# Patient Record
Sex: Male | Born: 1970 | Race: White | Hispanic: No | Marital: Single | Smoking: Current every day smoker
Health system: Southern US, Community
[De-identification: ages and names within clinical notes are randomized; demographics above are authoritative.]

## PROBLEM LIST (undated history)

## (undated) DIAGNOSIS — M1990 Unspecified osteoarthritis, unspecified site: Secondary | ICD-10-CM

## (undated) DIAGNOSIS — R519 Headache, unspecified: Secondary | ICD-10-CM

## (undated) DIAGNOSIS — E7800 Pure hypercholesterolemia, unspecified: Secondary | ICD-10-CM

## (undated) DIAGNOSIS — F329 Major depressive disorder, single episode, unspecified: Secondary | ICD-10-CM

## (undated) DIAGNOSIS — R51 Headache: Secondary | ICD-10-CM

## (undated) DIAGNOSIS — F32A Depression, unspecified: Secondary | ICD-10-CM

## (undated) HISTORY — PX: NO PAST SURGERIES: SHX2092

---

## 2015-03-18 ENCOUNTER — Ambulatory Visit (INDEPENDENT_AMBULATORY_CARE_PROVIDER_SITE_OTHER): Payer: Worker's Compensation | Admitting: Urgent Care

## 2015-03-18 VITALS — BP 122/67 | HR 93 | Temp 98.0°F | Resp 16 | Ht 69.0 in | Wt 177.2 lb

## 2015-03-18 DIAGNOSIS — S0081XA Abrasion of other part of head, initial encounter: Secondary | ICD-10-CM | POA: Diagnosis not present

## 2015-03-18 NOTE — Progress Notes (Signed)
    MRN: [id number] DOB: 08/25/1970  Subjective:   Isaac Jordan is a 44 y.o. male presenting for chief complaint of Facial Injury  Reports work injury to his right side of face today. Patient was working with round metal objects, one fell and bounced off the floor making impact with the right side of his nose, eye and cheek. Reports pain over right bridge of nose, right side of face, slightly blurred vision, mild headache. Has not tried any medications for relief. Denies eye swelling, foreign body sensation, eye redness, eye pain, confusion, disorientation, slurred speech, difficulty moving eyes, ear pain, nose bleeding, tooth pain, jaw pain. Per patient, TDAP is up to date, has had it within the last 10 years, does not want to have it updated today. Denies any other aggravating or relieving factors, no other questions or concerns.  Brett' medications list, allergies, pmh and psh were reviewed and excluded from this note due to being a worker's comp case.   ROS As in subjective.  Objective:   Vitals: BP 122/67 mmHg  Pulse 93  Temp(Src) 98 F (36.7 C) (Oral)  Resp 16  Ht  (1.753 m)  Wt 177 lb 3.2 oz (80.377 kg)  BMI 26.16 kg/m2  SpO2 97%  Physical Exam  Constitutional: He is oriented to person, place, and time. He appears well-developed and well-nourished.  HENT:  Head: Head is with abrasion. Head is without raccoon's eyes, without Battle's sign, without contusion, without laceration, without right periorbital erythema and without left periorbital erythema. Hair is normal.    TM's intact bilaterally, no effusions or erythema. Nares patent, nasal turbinates pink and moist. No sinus tenderness. Nose symmetric, bridge of nose without abnormality. Oropharynx clear, mucous membranes moist, dentition in good repair.  Eyes: Conjunctivae and EOM are normal. Pupils are equal, round, and reactive to light. Right eye exhibits no discharge. Left eye exhibits no discharge. No scleral  icterus.  Neck: Normal range of motion. Neck supple.  Cardiovascular: Normal rate.   Pulmonary/Chest: Effort normal.  Neurological: He is alert and oriented to person, place, and time. He has normal reflexes. No cranial nerve deficit. Coordination normal.  Skin: Skin is warm and dry. No rash noted. No erythema. No pallor.   Assessment and Plan :   1. Facial abrasion, initial encounter - Stable, patient's PE very reassuring, counseled on signs and symptoms of concussion/post-concussion syndrome. Patient is to return to work without restrictions, use ibuprofen for pain and inflammation.  Wallis Bamberg, PA-C [facility] [phone] 03/18/2015 1:57 PM

## 2015-03-18 NOTE — Patient Instructions (Signed)

## 2015-04-07 NOTE — Progress Notes (Signed)
  Medical screening examination/treatment/procedure(s) were performed by non-physician practitioner and as supervising physician I was immediately available for consultation/collaboration.     

## 2016-01-26 ENCOUNTER — Ambulatory Visit
Admission: RE | Admit: 2016-01-26 | Discharge: 2016-01-26 | Disposition: A | Discharge status: Home / Self Care | Payer: BLUE CROSS/BLUE SHIELD | Source: Ambulatory Visit | Attending: Family Medicine | Admitting: Family Medicine

## 2016-01-26 ENCOUNTER — Other Ambulatory Visit: Payer: Self-pay | Admitting: Family Medicine

## 2016-01-26 DIAGNOSIS — M25522 Pain in left elbow: Secondary | ICD-10-CM

## 2017-04-28 ENCOUNTER — Ambulatory Visit
Admission: RE | Admit: 2017-04-28 | Discharge: 2017-04-28 | Disposition: A | Discharge status: Home / Self Care | Payer: BLUE CROSS/BLUE SHIELD | Source: Ambulatory Visit | Attending: Family Medicine | Admitting: Family Medicine

## 2017-04-28 ENCOUNTER — Other Ambulatory Visit: Payer: Self-pay | Admitting: Family Medicine

## 2017-04-28 DIAGNOSIS — M545 Low back pain: Secondary | ICD-10-CM

## 2017-10-26 ENCOUNTER — Other Ambulatory Visit: Payer: Self-pay | Admitting: Family Medicine

## 2017-10-26 DIAGNOSIS — M5416 Radiculopathy, lumbar region: Secondary | ICD-10-CM

## 2017-10-27 ENCOUNTER — Ambulatory Visit
Admission: RE | Admit: 2017-10-27 | Discharge: 2017-10-27 | Disposition: A | Discharge status: Home / Self Care | Payer: BLUE CROSS/BLUE SHIELD | Source: Ambulatory Visit | Attending: Family Medicine | Admitting: Family Medicine

## 2017-10-27 ENCOUNTER — Other Ambulatory Visit: Payer: Self-pay | Admitting: Family Medicine

## 2017-10-27 DIAGNOSIS — M5416 Radiculopathy, lumbar region: Secondary | ICD-10-CM

## 2017-10-27 DIAGNOSIS — Z9889 Other specified postprocedural states: Secondary | ICD-10-CM

## 2017-10-28 ENCOUNTER — Ambulatory Visit
Admission: RE | Admit: 2017-10-28 | Discharge: 2017-10-28 | Disposition: A | Discharge status: Home / Self Care | Payer: BLUE CROSS/BLUE SHIELD | Source: Ambulatory Visit | Attending: Family Medicine | Admitting: Family Medicine

## 2017-10-28 DIAGNOSIS — Z9889 Other specified postprocedural states: Secondary | ICD-10-CM

## 2017-10-31 ENCOUNTER — Other Ambulatory Visit: Payer: Self-pay | Admitting: Neurosurgery

## 2017-11-02 ENCOUNTER — Other Ambulatory Visit: Payer: Self-pay

## 2017-11-02 ENCOUNTER — Encounter (HOSPITAL_COMMUNITY): Payer: Self-pay | Admitting: *Deleted

## 2017-11-02 NOTE — Progress Notes (Signed)
Spoke with pt for pre-op call. Pt denies cardiac history, HTN or diabetes.  

## 2017-11-03 ENCOUNTER — Encounter (HOSPITAL_COMMUNITY)
Admission: RE | Disposition: A | Discharge status: Home / Self Care | Payer: Self-pay | Source: Ambulatory Visit | Attending: Neurosurgery

## 2017-11-03 ENCOUNTER — Encounter (HOSPITAL_COMMUNITY): Payer: Self-pay | Admitting: *Deleted

## 2017-11-03 ENCOUNTER — Other Ambulatory Visit: Payer: Self-pay

## 2017-11-03 ENCOUNTER — Ambulatory Visit (HOSPITAL_COMMUNITY)
Admission: RE | Admit: 2017-11-03 | Discharge: 2017-11-03 | Disposition: A | Discharge status: Home / Self Care | Payer: BLUE CROSS/BLUE SHIELD | Source: Ambulatory Visit | Attending: Neurosurgery | Admitting: Neurosurgery

## 2017-11-03 ENCOUNTER — Ambulatory Visit (HOSPITAL_COMMUNITY): Payer: BLUE CROSS/BLUE SHIELD

## 2017-11-03 ENCOUNTER — Ambulatory Visit (HOSPITAL_COMMUNITY): Payer: BLUE CROSS/BLUE SHIELD | Admitting: Certified Registered"

## 2017-11-03 DIAGNOSIS — M5126 Other intervertebral disc displacement, lumbar region: Secondary | ICD-10-CM | POA: Diagnosis present

## 2017-11-03 DIAGNOSIS — F17200 Nicotine dependence, unspecified, uncomplicated: Secondary | ICD-10-CM | POA: Diagnosis not present

## 2017-11-03 DIAGNOSIS — E7800 Pure hypercholesterolemia, unspecified: Secondary | ICD-10-CM | POA: Insufficient documentation

## 2017-11-03 DIAGNOSIS — Z79899 Other long term (current) drug therapy: Secondary | ICD-10-CM | POA: Diagnosis not present

## 2017-11-03 DIAGNOSIS — Z419 Encounter for procedure for purposes other than remedying health state, unspecified: Secondary | ICD-10-CM

## 2017-11-03 HISTORY — DX: Major depressive disorder, single episode, unspecified: F32.9

## 2017-11-03 HISTORY — DX: Depression, unspecified: F32.A

## 2017-11-03 HISTORY — DX: Headache, unspecified: R51.9

## 2017-11-03 HISTORY — DX: Headache: R51

## 2017-11-03 HISTORY — DX: Unspecified osteoarthritis, unspecified site: M19.90

## 2017-11-03 HISTORY — DX: Pure hypercholesterolemia, unspecified: E78.00

## 2017-11-03 HISTORY — PX: LUMBAR LAMINECTOMY/DECOMPRESSION MICRODISCECTOMY: SHX5026

## 2017-11-03 LAB — CBC
HCT: 50 % (ref 39.0–52.0)
HEMOGLOBIN: 17.2 g/dL — AB (ref 13.0–17.0)
MCH: 31.8 pg (ref 26.0–34.0)
MCHC: 34.4 g/dL (ref 30.0–36.0)
MCV: 92.4 fL (ref 78.0–100.0)
PLATELETS: 166 10*3/uL (ref 150–400)
RBC: 5.41 MIL/uL (ref 4.22–5.81)
RDW: 13.5 % (ref 11.5–15.5)
WBC: 11.8 10*3/uL — ABNORMAL HIGH (ref 4.0–10.5)

## 2017-11-03 SURGERY — LUMBAR LAMINECTOMY/DECOMPRESSION MICRODISCECTOMY 1 LEVEL
Anesthesia: General | Site: Spine Lumbar | Laterality: Left

## 2017-11-03 MED ORDER — ROCURONIUM BROMIDE 100 MG/10ML IV SOLN
INTRAVENOUS | Status: DC | PRN
  Administered 2017-11-03: 40 mg via INTRAVENOUS

## 2017-11-03 MED ORDER — OXYCODONE HCL 5 MG PO TABS: 10.0000 mg | ORAL_TABLET | ORAL | Status: DC | PRN

## 2017-11-03 MED ORDER — SODIUM CHLORIDE 0.9% FLUSH: 3.0000 mL | Freq: Two times a day (BID) | INTRAVENOUS | Status: DC

## 2017-11-03 MED ORDER — FENTANYL CITRATE (PF) 100 MCG/2ML IJ SOLN
INTRAMUSCULAR | Status: AC
  Administered 2017-11-03: 50 ug via INTRAVENOUS
  Filled 2017-11-03: qty 2

## 2017-11-03 MED ORDER — SUGAMMADEX SODIUM 200 MG/2ML IV SOLN
INTRAVENOUS | Status: AC
  Filled 2017-11-03: qty 4

## 2017-11-03 MED ORDER — ACETAMINOPHEN 650 MG RE SUPP: 650.0000 mg | RECTAL | Status: DC | PRN

## 2017-11-03 MED ORDER — FENTANYL CITRATE (PF) 250 MCG/5ML IJ SOLN
INTRAMUSCULAR | Status: AC
  Filled 2017-11-03: qty 5

## 2017-11-03 MED ORDER — FENTANYL CITRATE (PF) 250 MCG/5ML IJ SOLN
INTRAMUSCULAR | Status: AC
Start: 2017-11-03 — End: ?
  Filled 2017-11-03: qty 5

## 2017-11-03 MED ORDER — CEFAZOLIN SODIUM-DEXTROSE 2-4 GM/100ML-% IV SOLN
2.0000 g | INTRAVENOUS | Status: AC
  Administered 2017-11-03: 2 g via INTRAVENOUS
  Filled 2017-11-03: qty 100

## 2017-11-03 MED ORDER — OXYCODONE HCL ER 10 MG PO T12A
10.0000 mg | EXTENDED_RELEASE_TABLET | Freq: Two times a day (BID) | ORAL | Status: DC
  Administered 2017-11-03: 10 mg via ORAL
  Filled 2017-11-03: qty 1

## 2017-11-03 MED ORDER — ONDANSETRON HCL 4 MG/2ML IJ SOLN: 4.0000 mg | Freq: Four times a day (QID) | INTRAMUSCULAR | Status: DC | PRN

## 2017-11-03 MED ORDER — CHLORHEXIDINE GLUCONATE CLOTH 2 % EX PADS: 6.0000 | MEDICATED_PAD | Freq: Once | CUTANEOUS | Status: DC

## 2017-11-03 MED ORDER — PHENOL 1.4 % MT LIQD: 1.0000 | OROMUCOSAL | Status: DC | PRN

## 2017-11-03 MED ORDER — ROSUVASTATIN CALCIUM 20 MG PO TABS: 20.0000 mg | ORAL_TABLET | Freq: Every day | ORAL | Status: DC

## 2017-11-03 MED ORDER — SUGAMMADEX SODIUM 200 MG/2ML IV SOLN
INTRAVENOUS | Status: DC | PRN
  Administered 2017-11-03: 180 mg via INTRAVENOUS

## 2017-11-03 MED ORDER — LIDOCAINE HCL (CARDIAC) 20 MG/ML IV SOLN
INTRAVENOUS | Status: DC | PRN
  Administered 2017-11-03: 100 mg via INTRAVENOUS

## 2017-11-03 MED ORDER — ACETAMINOPHEN 325 MG PO TABS: 650.0000 mg | ORAL_TABLET | ORAL | Status: DC | PRN

## 2017-11-03 MED ORDER — MUPIROCIN 2 % EX OINT
1.0000 "application " | TOPICAL_OINTMENT | Freq: Once | CUTANEOUS | Status: AC
  Administered 2017-11-03: 1 via TOPICAL

## 2017-11-03 MED ORDER — PROPOFOL 10 MG/ML IV BOLUS
INTRAVENOUS | Status: DC | PRN
  Administered 2017-11-03: 200 mg via INTRAVENOUS

## 2017-11-03 MED ORDER — MIDAZOLAM HCL 2 MG/2ML IJ SOLN
INTRAMUSCULAR | Status: AC
  Filled 2017-11-03: qty 2

## 2017-11-03 MED ORDER — THROMBIN (RECOMBINANT) 5000 UNITS EX SOLR
CUTANEOUS | Status: DC | PRN
  Administered 2017-11-03 (×2): 5000 [IU] via TOPICAL

## 2017-11-03 MED ORDER — OXYCODONE HCL 5 MG PO TABS: 5.0000 mg | ORAL_TABLET | Freq: Once | ORAL | Status: DC | PRN

## 2017-11-03 MED ORDER — MENTHOL 3 MG MT LOZG: 1.0000 | LOZENGE | OROMUCOSAL | Status: DC | PRN

## 2017-11-03 MED ORDER — FENTANYL CITRATE (PF) 100 MCG/2ML IJ SOLN
50.0000 ug | Freq: Once | INTRAMUSCULAR | Status: AC
  Administered 2017-11-03 (×2): 50 ug via INTRAVENOUS

## 2017-11-03 MED ORDER — ONDANSETRON HCL 4 MG PO TABS: 4.0000 mg | ORAL_TABLET | Freq: Four times a day (QID) | ORAL | Status: DC | PRN

## 2017-11-03 MED ORDER — SODIUM CHLORIDE 0.9% FLUSH: 3.0000 mL | INTRAVENOUS | Status: DC | PRN

## 2017-11-03 MED ORDER — ONDANSETRON HCL 4 MG/2ML IJ SOLN
INTRAMUSCULAR | Status: DC | PRN
  Administered 2017-11-03: 4 mg via INTRAVENOUS

## 2017-11-03 MED ORDER — DEXAMETHASONE SODIUM PHOSPHATE 10 MG/ML IJ SOLN
INTRAMUSCULAR | Status: AC
  Filled 2017-11-03: qty 4

## 2017-11-03 MED ORDER — LACTATED RINGERS IV SOLN
INTRAVENOUS | Status: DC
  Administered 2017-11-03 (×2): via INTRAVENOUS

## 2017-11-03 MED ORDER — OXYCODONE HCL 5 MG/5ML PO SOLN: 5.0000 mg | Freq: Once | ORAL | Status: DC | PRN

## 2017-11-03 MED ORDER — ROCURONIUM BROMIDE 10 MG/ML (PF) SYRINGE
PREFILLED_SYRINGE | INTRAVENOUS | Status: AC
  Filled 2017-11-03: qty 20

## 2017-11-03 MED ORDER — MIDAZOLAM HCL 5 MG/5ML IJ SOLN
INTRAMUSCULAR | Status: DC | PRN
  Administered 2017-11-03: 2 mg via INTRAVENOUS

## 2017-11-03 MED ORDER — FENTANYL CITRATE (PF) 100 MCG/2ML IJ SOLN: 50.0000 ug | Freq: Once | INTRAMUSCULAR | Status: AC

## 2017-11-03 MED ORDER — GABAPENTIN 300 MG PO CAPS
300.0000 mg | ORAL_CAPSULE | Freq: Three times a day (TID) | ORAL | Status: DC
  Administered 2017-11-03: 300 mg via ORAL
  Filled 2017-11-03: qty 1

## 2017-11-03 MED ORDER — LIDOCAINE-EPINEPHRINE 0.5 %-1:200000 IJ SOLN
INTRAMUSCULAR | Status: DC | PRN
  Administered 2017-11-03: 30 mL

## 2017-11-03 MED ORDER — DEXAMETHASONE SODIUM PHOSPHATE 10 MG/ML IJ SOLN
INTRAMUSCULAR | Status: DC | PRN
  Administered 2017-11-03: 4 mg via INTRAVENOUS

## 2017-11-03 MED ORDER — HYDROMORPHONE HCL 1 MG/ML IJ SOLN: 0.2500 mg | INTRAMUSCULAR | Status: DC | PRN

## 2017-11-03 MED ORDER — HEMOSTATIC AGENTS (NO CHARGE) OPTIME
TOPICAL | Status: DC | PRN
  Administered 2017-11-03: 1 via TOPICAL

## 2017-11-03 MED ORDER — LIDOCAINE HCL (CARDIAC) 20 MG/ML IV SOLN
INTRAVENOUS | Status: AC
  Filled 2017-11-03: qty 15

## 2017-11-03 MED ORDER — 0.9 % SODIUM CHLORIDE (POUR BTL) OPTIME
TOPICAL | Status: DC | PRN
  Administered 2017-11-03: 1000 mL

## 2017-11-03 MED ORDER — ONDANSETRON HCL 4 MG/2ML IJ SOLN
INTRAMUSCULAR | Status: AC
  Filled 2017-11-03: qty 4

## 2017-11-03 MED ORDER — TIZANIDINE HCL 4 MG PO TABS
4.0000 mg | ORAL_TABLET | Freq: Three times a day (TID) | ORAL | Status: DC
  Administered 2017-11-03: 4 mg via ORAL
  Filled 2017-11-03: qty 1

## 2017-11-03 MED ORDER — THROMBIN 5000 UNITS EX SOLR
CUTANEOUS | Status: AC
  Filled 2017-11-03: qty 10000

## 2017-11-03 MED ORDER — SUCCINYLCHOLINE CHLORIDE 20 MG/ML IJ SOLN
INTRAMUSCULAR | Status: DC | PRN
  Administered 2017-11-03: 120 mg via INTRAVENOUS

## 2017-11-03 MED ORDER — KETOROLAC TROMETHAMINE 15 MG/ML IJ SOLN
15.0000 mg | Freq: Four times a day (QID) | INTRAMUSCULAR | Status: DC
  Administered 2017-11-03: 15 mg via INTRAVENOUS
  Filled 2017-11-03: qty 1

## 2017-11-03 MED ORDER — LIDOCAINE-EPINEPHRINE 0.5 %-1:200000 IJ SOLN
INTRAMUSCULAR | Status: AC
  Filled 2017-11-03: qty 1

## 2017-11-03 MED ORDER — FENTANYL CITRATE (PF) 100 MCG/2ML IJ SOLN
INTRAMUSCULAR | Status: DC | PRN
  Administered 2017-11-03 (×2): 100 ug via INTRAVENOUS
  Administered 2017-11-03: 50 ug via INTRAVENOUS

## 2017-11-03 MED ORDER — POTASSIUM CHLORIDE IN NACL 20-0.9 MEQ/L-% IV SOLN: INTRAVENOUS | Status: DC

## 2017-11-03 MED ORDER — PROPOFOL 10 MG/ML IV BOLUS
INTRAVENOUS | Status: AC
  Filled 2017-11-03: qty 40

## 2017-11-03 MED ORDER — SUCCINYLCHOLINE CHLORIDE 20 MG/ML IJ SOLN
INTRAMUSCULAR | Status: AC
  Filled 2017-11-03: qty 1

## 2017-11-03 MED ORDER — OXYCODONE HCL 5 MG PO TABS: 5.0000 mg | ORAL_TABLET | ORAL | Status: DC | PRN

## 2017-11-03 MED ORDER — MORPHINE SULFATE (PF) 4 MG/ML IV SOLN: 1.0000 mg | INTRAVENOUS | Status: DC | PRN

## 2017-11-03 MED ORDER — MUPIROCIN 2 % EX OINT
TOPICAL_OINTMENT | CUTANEOUS | Status: AC
  Administered 2017-11-03: 1 via TOPICAL
  Filled 2017-11-03: qty 22

## 2017-11-03 SURGICAL SUPPLY — 46 items
BUR MATCHSTICK NEURO 3.0 LAGG (BURR) ×3 IMPLANT
BUR PRECISION FLUTE 5.0 (BURR) IMPLANT
CANISTER SUCT 3000ML PPV (MISCELLANEOUS) ×3 IMPLANT
CARTRIDGE OIL MAESTRO DRILL (MISCELLANEOUS) ×1 IMPLANT
CLOSURE WOUND 1/2 X4 (GAUZE/BANDAGES/DRESSINGS)
DECANTER SPIKE VIAL GLASS SM (MISCELLANEOUS) ×3 IMPLANT
DERMABOND ADVANCED (GAUZE/BANDAGES/DRESSINGS) ×2
DERMABOND ADVANCED .7 DNX12 (GAUZE/BANDAGES/DRESSINGS) ×1 IMPLANT
DIFFUSER DRILL AIR PNEUMATIC (MISCELLANEOUS) ×3 IMPLANT
DRAPE LAPAROTOMY 100X72X124 (DRAPES) ×3 IMPLANT
DRAPE MICROSCOPE LEICA (MISCELLANEOUS) ×3 IMPLANT
DRAPE SURG 17X23 STRL (DRAPES) ×3 IMPLANT
DURAPREP 26ML APPLICATOR (WOUND CARE) ×3 IMPLANT
ELECT REM PT RETURN 9FT ADLT (ELECTROSURGICAL) ×3
ELECTRODE REM PT RTRN 9FT ADLT (ELECTROSURGICAL) ×1 IMPLANT
GAUZE SPONGE 4X4 12PLY STRL (GAUZE/BANDAGES/DRESSINGS) IMPLANT
GAUZE SPONGE 4X4 16PLY XRAY LF (GAUZE/BANDAGES/DRESSINGS) IMPLANT
GLOVE BIOGEL PI IND STRL 7.0 (GLOVE) ×1 IMPLANT
GLOVE BIOGEL PI IND STRL 7.5 (GLOVE) ×1 IMPLANT
GLOVE BIOGEL PI INDICATOR 7.0 (GLOVE) ×2
GLOVE BIOGEL PI INDICATOR 7.5 (GLOVE) ×2
GLOVE ECLIPSE 6.5 STRL STRAW (GLOVE) ×3 IMPLANT
GOWN STRL REUS W/ TWL LRG LVL3 (GOWN DISPOSABLE) ×2 IMPLANT
GOWN STRL REUS W/ TWL XL LVL3 (GOWN DISPOSABLE) ×1 IMPLANT
GOWN STRL REUS W/TWL 2XL LVL3 (GOWN DISPOSABLE) IMPLANT
GOWN STRL REUS W/TWL LRG LVL3 (GOWN DISPOSABLE) ×4
GOWN STRL REUS W/TWL XL LVL3 (GOWN DISPOSABLE) ×2
KIT BASIN OR (CUSTOM PROCEDURE TRAY) ×3 IMPLANT
KIT ROOM TURNOVER OR (KITS) ×3 IMPLANT
NEEDLE HYPO 25X1 1.5 SAFETY (NEEDLE) ×3 IMPLANT
NEEDLE SPNL 18GX3.5 QUINCKE PK (NEEDLE) IMPLANT
NS IRRIG 1000ML POUR BTL (IV SOLUTION) ×3 IMPLANT
OIL CARTRIDGE MAESTRO DRILL (MISCELLANEOUS) ×3
PACK LAMINECTOMY NEURO (CUSTOM PROCEDURE TRAY) ×3 IMPLANT
PAD ARMBOARD 7.5X6 YLW CONV (MISCELLANEOUS) ×9 IMPLANT
RUBBERBAND STERILE (MISCELLANEOUS) ×6 IMPLANT
SPONGE LAP 4X18 X RAY DECT (DISPOSABLE) IMPLANT
SPONGE SURGIFOAM ABS GEL SZ50 (HEMOSTASIS) ×3 IMPLANT
STRIP CLOSURE SKIN 1/2X4 (GAUZE/BANDAGES/DRESSINGS) IMPLANT
SUT VIC AB 0 CT1 18XCR BRD8 (SUTURE) ×1 IMPLANT
SUT VIC AB 0 CT1 8-18 (SUTURE) ×2
SUT VIC AB 2-0 CT1 18 (SUTURE) ×3 IMPLANT
SUT VIC AB 3-0 SH 8-18 (SUTURE) ×3 IMPLANT
TOWEL GREEN STERILE (TOWEL DISPOSABLE) ×3 IMPLANT
TOWEL GREEN STERILE FF (TOWEL DISPOSABLE) ×3 IMPLANT
WATER STERILE IRR 1000ML POUR (IV SOLUTION) ×3 IMPLANT

## 2017-11-03 NOTE — Anesthesia Postprocedure Evaluation (Signed)
Anesthesia Post Note  Patient: Toula Moosicholas Bartolini  Procedure(s) Performed: MICRODISCECTOMY LUMBAR FOUR- LUMBAR FIVE LEFT (Left Spine Lumbar)     Patient location during evaluation: PACU Anesthesia Type: General Level of consciousness: awake and alert Pain management: pain level controlled Vital Signs Assessment: post-procedure vital signs reviewed and stable Respiratory status: spontaneous breathing, nonlabored ventilation, respiratory function stable and patient connected to nasal cannula oxygen Cardiovascular status: blood pressure returned to baseline and stable Postop Assessment: no apparent nausea or vomiting Anesthetic complications: no    Last Vitals:  Vitals:   11/03/17 1415 11/03/17 1425  BP: (!) 145/92 (!) 146/89  Pulse: 86 90  Resp: 12 12  Temp:  36.6 C  SpO2: 97% 94%    Last Pain:  Vitals:   11/03/17 1425  TempSrc:   PainSc: 0-No pain    LLE Motor Response: Purposeful movement;Responds to commands (11/03/17 1425) LLE Sensation: Full sensation;No numbness;No pain;No tingling (11/03/17 1425) RLE Motor Response: Purposeful movement;Responds to commands (11/03/17 1425) RLE Sensation: Full sensation;No numbness;No tingling;No pain (11/03/17 1425)      Lydiana Milley,JAMES TERRILL

## 2017-11-03 NOTE — H&P (Signed)
BP (!) 157/93   Pulse 88   Temp 98.5 F (36.9 C) (Oral)   Resp 18   Ht 5\' 7"  (1.702 m)   Wt 90.7 kg (200 lb)   SpO2 100%   BMI 31.32 kg/m  Mr. Toula Moosicholas Wickens presents to the office today for evaluation of pain which he has had now for approximately a month. It became quite severe 2 weeks ago. He has had no antecedent trauma. He did lift a relative who is heavy, but they are not sure that did anything. 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 He has not been able to work since last week Thursday. He describes pain, numbness, and left hip pain in the left thigh and leg. He does report it insomnia. Says it hurts even worse when he stands. He says his left lower extremity does feel weaker full than it has.  He is 47 years of age. Height 5 feet 7 inches, temperature is 97.1 blood pressure is 147/95, pulse is 90. Pain is 9/10.  He does drink alcohol socially. Does smoke 2 packs a day for a 60 pack-year history. He has no history of substance abuse. He is right handed. PAST MEDICAL HISTORY: Otherwise good. ALLERGIES: No known drug allergie MEDICATIONS: He takes Rosuvastatin, Hydrocodone, and Omega-3 pills. FAMILY HISTORY: Mother is [id number], in good health. Father is deceased, had heart failure.  He says the pain truly started in September of 2018 and has simply gotten worse. He says in his words he can hardly walk, not able have a bowel movement in 3 days secondary to constipation caused by the pain medication. REVIEW OF SYSTEMS: Positive for hypercholesterolemia, leg pain with walking, arthritis, back pain, leg pain at rest. PHYSICAL EXAMINATION: He has intact proprioception in the upper and lower extremities. 2+ reflexes, biceps, triceps, brachioradialis, knees, and ankles. Pupils equal, round, and reactive to light. Full extraocular movements. Full visual fields. Hearing intact to voice. Uvula elevates midline. Shoulder shrug is normal. Tongue protrudes in the midline. He has symmetric facial  sensation and movement. He has downgoing toes to plantar stimulation. He has weakness in the left hip flexors, left quadriceps. Dorsiflexors, plantar flexors are normal. Normal strength in the right lower extremity. He has exceeding difficulty standing from a sitting position. He has great difficulty taking a 14-inch step using his left lower extremity. Actually, he is unable to get up on a step.  MRI shows a large disc herniation eccentric to the left with a free fragment. I do believe this is a problem that he is having. I do believe that removing this disc herniation would relieve much of his pain and improve his strength. I do not believe that there is any other reasonable alternative. We did discuss injections, we did discuss physical therapy, and both would be ineffective. Given the fact that he is weak and given the fact that he does have a disc herniation consistent with territory weakness, I have recommended and he has agreed to undergo operative decompression. Risks and benefits including bleeding, infection, no pain relief, need for further surgery, disc recurrence, damage to the nerve roots, damage to the bowel and bladder function, along with other risks were discussed. He received a detailed instruction sheet with regard to the operation.

## 2017-11-03 NOTE — Anesthesia Procedure Notes (Signed)
Procedure Name: Intubation Date/Time: 11/03/2017 11:51 AM Performed by: Josie Dixon, CRNA Pre-anesthesia Checklist: Patient identified, Emergency Drugs available, Suction available and Patient being monitored Patient Re-evaluated:Patient Re-evaluated prior to induction Oxygen Delivery Method: Circle system utilized Preoxygenation: Pre-oxygenation with 100% oxygen Induction Type: IV induction Ventilation: Mask ventilation without difficulty and Mask ventilation throughout procedure Laryngoscope Size: Mac and 3 Grade View: Grade I Tube type: Oral Tube size: 7.5 mm Number of attempts: 1 Airway Equipment and Method: Stylet Placement Confirmation: ETT inserted through vocal cords under direct vision,  positive ETCO2 and breath sounds checked- equal and bilateral Secured at: 22 cm Tube secured with: Tape Dental Injury: Teeth and Oropharynx as per pre-operative assessment

## 2017-11-03 NOTE — Transfer of Care (Signed)
Immediate Anesthesia Transfer of Care Note  Patient: Isaac Jordan  Procedure(s) Performed: MICRODISCECTOMY LUMBAR FOUR- LUMBAR FIVE LEFT (Left Spine Lumbar)  Patient Location: PACU  Anesthesia Type:General  Level of Consciousness: drowsy and patient cooperative  Airway & Oxygen Therapy: Patient Spontanous Breathing and Patient connected to face mask oxygen  Post-op Assessment: Report given to RN and Post -op Vital signs reviewed and stable  Post vital signs: Reviewed and stable  Last Vitals:  Vitals:   11/03/17 0926 11/03/17 0943  BP: (!) 146/101 (!) 157/93  Pulse: 88   Resp: 18   Temp: 36.9 C   SpO2: 100%     Last Pain:  Vitals:   11/03/17 0937  TempSrc:   PainSc: 8       Patients Stated Pain Goal: 5 (11/03/17 0937)  Complications: No apparent anesthesia complications

## 2017-11-03 NOTE — Anesthesia Preprocedure Evaluation (Addendum)
Anesthesia Evaluation  Patient identified by MRN, date of birth, ID band Patient awake    Reviewed: Allergy & Precautions, NPO status , Patient's Chart, lab work & pertinent test results  Airway Mallampati: I  TM Distance: >3 FB Neck ROM: Full    Dental no notable dental hx. (+) Teeth Intact, Dental Advisory Given   Pulmonary neg pulmonary ROS, Current Smoker,    breath sounds clear to auscultation       Cardiovascular negative cardio ROS   Rhythm:Regular Rate:Normal     Neuro/Psych negative neurological ROS  negative psych ROS   GI/Hepatic negative GI ROS, Neg liver ROS,   Endo/Other  negative endocrine ROS  Renal/GU negative Renal ROS  negative genitourinary   Musculoskeletal negative musculoskeletal ROS (+) Back pain   Abdominal   Peds negative pediatric ROS (+)  Hematology negative hematology ROS (+)   Anesthesia Other Findings   Reproductive/Obstetrics negative OB ROS                            Anesthesia Physical Anesthesia Plan  ASA: II  Anesthesia Plan: General   Post-op Pain Management:    Induction: Intravenous  PONV Risk Score and Plan: 2 and Treatment may vary due to age or medical condition, Dexamethasone and Ondansetron  Airway Management Planned: Oral ETT  Additional Equipment:   Intra-op Plan:   Post-operative Plan: Extubation in OR  Informed Consent: I have reviewed the patients History and Physical, chart, labs and discussed the procedure including the risks, benefits and alternatives for the proposed anesthesia with the patient or authorized representative who has indicated his/her understanding and acceptance.   Dental advisory given  Plan Discussed with: CRNA  Anesthesia Plan Comments:         Anesthesia Quick Evaluation

## 2017-11-03 NOTE — Discharge Summary (Signed)
Physician Discharge Summary  Patient ID: Isaac Jordan MRN: [id number] DOB/AGE: 45/11/1970 45 y.o.  Admit date: 11/03/2017 Discharge date: 11/03/2017  Admission Diagnoses:HNP L4/5, left  Discharge Diagnoses: same Active Problems:   HNP (herniated nucleus pulposus), lumbar   Discharged Condition: good  Hospital Course: [name] was taken to the OR for an uncomplicated lumbar laminectomy and discetomy at L4/5 on the left. Post op he is ambulating, voiding, and tolerating a regular diet. His wound is clean, dry, and without signs of infection. He will be discharged home.  Treatments: surgery: as above  Discharge Exam: Blood pressure (!) 146/92, pulse 90, temperature 98.5 F (36.9 C), temperature source Oral, resp. rate 16, height 5\' 7"  (1.702 m), weight 90.7 kg (200 lb), SpO2 96 %. General appearance: alert, cooperative and appears stated age Neurologic: Alert and oriented X 3, normal strength and tone. Normal symmetric reflexes. Normal coordination and gait  Disposition: Discharge disposition: 01-Home or Self Care      DISC DISPLACEMENT, LUMBAR  Allergies as of 11/03/2017   No Known Allergies     Medication List    TAKE these medications   FISH OIL PO Take 2 capsules by mouth daily.   HYDROcodone-acetaminophen 5-325 MG tablet Commonly known as:  NORCO/VICODIN Take 1 tablet by mouth every 4 (four) hours as needed for moderate pain.   ibuprofen 200 MG tablet Commonly known as:  ADVIL,MOTRIN Take 400 mg by mouth every 6 (six) hours as needed for headache or moderate pain.   naproxen sodium 220 MG tablet Commonly known as:  ALEVE Take 440 mg by mouth daily as needed (for pain or headache).   rosuvastatin 20 MG tablet Commonly known as:  CRESTOR Take 20 mg by mouth at bedtime.   tiZANidine 4 MG tablet Commonly known as:  ZANAFLEX Take 4 mg by mouth 3 (three) times daily.      Follow-up Information    Coletta Memosabbell, Zali Kamaka, MD Follow up in 3 week(s).   Specialty:   Neurosurgery Why:  please call to make an appointment Contact information: [address] [place] [address] [phone]           Signed: Carmela HurtCABBELL,Saryn Cherry L 11/03/2017, 7:09 PM

## 2017-11-03 NOTE — Progress Notes (Signed)
Patient discharge to home with family members. Discharge instructions given and verbalized understanding. Denies pain and in no signs of distress at the time of discharge.

## 2017-11-03 NOTE — Discharge Instructions (Signed)
Lumbar Discectomy °Care After °A discectomy involves removal of discmaterial (the cartilage-like structures located between the bones of the back). It is done to relieve pressure on nerve roots. It can be used as a treatment for a back problem. The time in surgery depends on the findings in surgery and what is necessary to correct the problems. °HOME CARE INSTRUCTIONS  °· Check the cut (incision) made by the surgeon twice a day for signs of infection. Some signs of infection may include:  °· A foul smelling, greenish or yellowish discharge from the wound.  °· Increased pain.  °· Increased redness over the incision (operative) site.  °· The skin edges may separate.  °· Flu-like symptoms (problems).  °· A temperature above 101.5° F (38.6° C).  °· Change your bandages in about 24 to 36 hours following surgery or as directed.  °· You may shower tomrrow.  Avoid bathtubs, swimming pools and hot tubs for three weeks or until your incision has healed completely. °· Follow your doctor's instructions as to safe activities, exercises, and physical therapy.  °· Weight reduction may be beneficial if you are overweight.  °· Daily exercise is helpful to prevent the return of problems. Walking is permitted. You may use a treadmill without an incline. Cut down on activities and exercise if you have discomfort. You may also go up and down stairs as much as you can tolerate.  °· DO NOT lift anything heavier than 10 to 15 lbs. Avoid bending or twisting at the waist. Always bend your knees when lifting.  °· Maintain strength and range of motion as instructed.  °· Do not drive for 10 days, or as directed by your doctors. You may be a passenger . Lying back in the passenger seat may be more comfortable for you. Always wear a seatbelt.  °· Limit your sitting in a regular chair to 20 to 30 minutes at a time. There are no limitations for sitting in a recliner. You should lie down or walk in between sitting periods.  °· Only take  over-the-counter or prescription medicines for pain, discomfort, or fever as directed by your caregiver.  °SEEK MEDICAL CARE IF:  °· There is increased bleeding (more than a small spot) from the wound.  °· You notice redness, swelling, or increasing pain in the wound.  °· Pus is coming from wound.  °· You develop an unexplained oral temperature above 102° F (38.9° C) develops.  °· You notice a foul smell coming from the wound or dressing.  °· You have increasing pain in your wound.  °SEEK IMMEDIATE MEDICAL CARE IF:  °· You develop a rash.  °· You have difficulty breathing.  °· You develop any allergic problems to medicines given.  °Document Released: 07/13/2004 Document Revised: 07/28/2011 Document Reviewed: 11/01/2007 °ExitCare® Patient Information °

## 2017-11-03 NOTE — Op Note (Signed)
11/03/2017  1:29 PM  PATIENT:  Isaac Jordan  47 y.o. male with severe pain in the left lower extremity and a large hnp eccentric to the left side with a rostrally migrated free fragment  PRE-OPERATIVE DIAGNOSIS:  DISC DISPLACEMENT, LUMBAR 4/5  POST-OPERATIVE DIAGNOSIS:  DISC DISPLACEMENT, LUMBAR 4/5  PROCEDURE:  Procedure(s): MICRODISCECTOMY LUMBAR FOUR- LUMBAR FIVE LEFT  SURGEON:   Surgeon(s): Coletta Memosabbell, Rahim Astorga, MD  ASSISTANTS:none  ANESTHESIA:   general  EBL:  No intake/output data recorded.  BLOOD ADMINISTERED:none  CELL SAVER GIVEN:none  COUNT:per nursing  DRAINS: none   SPECIMEN:  No Specimen  DICTATION: [name] was taken to the operating room, intubated and placed under a general anesthetic without difficulty. He was positioned prone on a Wilson frame with all pressure points padded. His back was prepped and draped in a sterile manner. I opened the skin with a 10 blade and carried the dissection down to the thoracolumbar fascia. I used both sharp dissection and the monopolar cautery to expose the lamina of L4, and L5. I confirmed my location with an intraoperative xray.  I used the drill, Kerrison punches, and curettes to perform a semihemilaminectomy of L4. I used the punches to remove the ligamentum flavum to expose the thecal sac. I brought the microscope into the operative field and started the decompression of the spinal canal, thecal sac and L4, and L5 root(s). I cauterized epidural veins overlying the disc space then divided them sharply. I used a right angle ball dissector and removed pieces of disc rostral to the disc space. I also remove disc fragments from the neural foramen on the left at L4/5. I opened the disc space with a 15 blade and proceeded with the discectomy. I used pituitary rongeurs, curettes, and other instruments to remove disc material. After the discectomy was completed I inspected the L4, and L5 nerve roots and felt they were well decompressed. I  explored rostrally, laterally, medially, and caudally and was satisfied with the decompression. I irrigated the wound, then closed in layers. I approximated the thoracolumbar fascia, subcutaneous, and subcuticular planes with vicryl sutures. I used dermabond for a sterile dressing.   PLAN OF CARE: Admit for overnight observation  PATIENT DISPOSITION:  PACU - hemodynamically stable.   Delay start of Pharmacological VTE agent (>24hrs) due to surgical blood loss or risk of bleeding:  yes

## 2017-11-04 ENCOUNTER — Encounter (HOSPITAL_COMMUNITY): Payer: Self-pay | Admitting: Neurosurgery

## 2019-04-16 IMAGING — MR MR LUMBAR SPINE W/O CM
4 of 5 series · 21 of 48 positions shown · non-contrast
Comparison: 04/28/2017 lumbar spine radiographs

CLINICAL DATA: 46 y/o M; 1 month of mid lower back pain, severe
pain for 2 weeks, pain radiating to the left buttocks and left leg
with numbness.

EXAM:
MRI LUMBAR SPINE WITHOUT CONTRAST
TECHNIQUE: Multiplanar, multisequence MR imaging of the lumbar spine was
performed. No intravenous contrast was administered.

[Series 3: T2 · sagittal · 4.0mm · 0.78mm/px · 6 of 17 slices shown (1 of 2)]
[im 1/17]
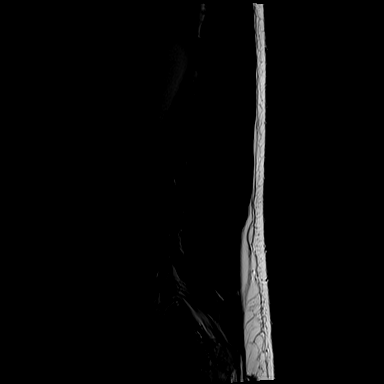
[im 4/17]
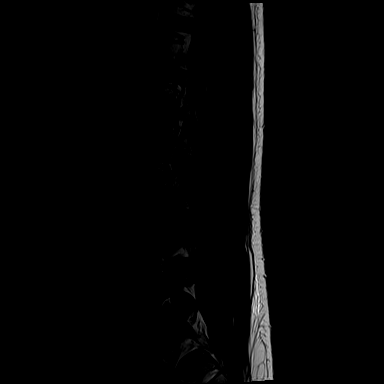
[im 7/17]
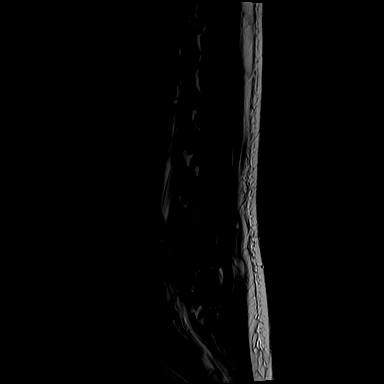
[im 10/17]
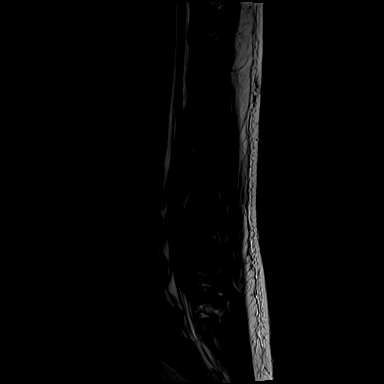
[im 13/17]
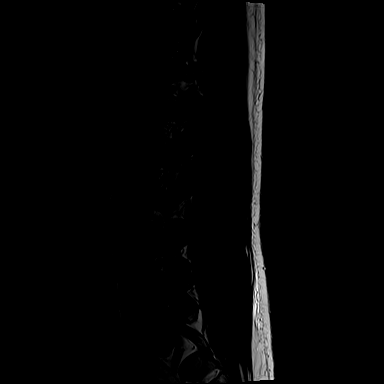
[im 17/17]
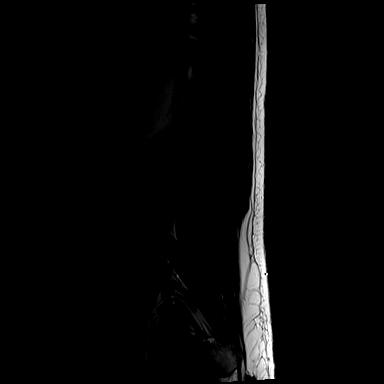

[Series 5: T1 · sagittal · 4.0mm · 0.78mm/px · 3 of 17 slices shown (1 of 2)]
[im 1/17]
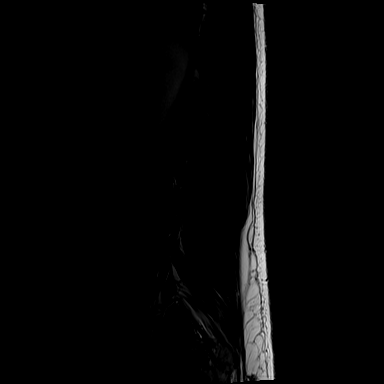
[im 9/17]
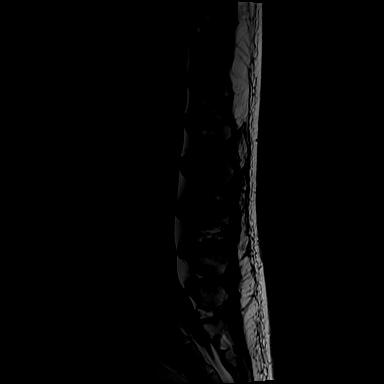
[im 17/17]
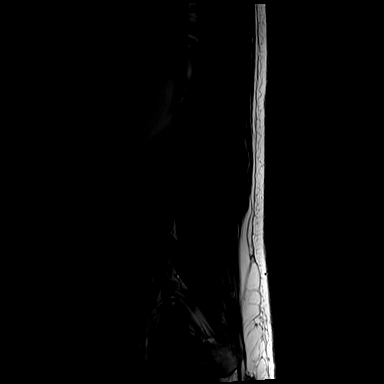

[Series 6: T2 · axial · 4.0mm · 0.39mm/px · z∈[-118,+90]mm · 9 of 52 slices shown (2 of 2)]
[im 4/52]
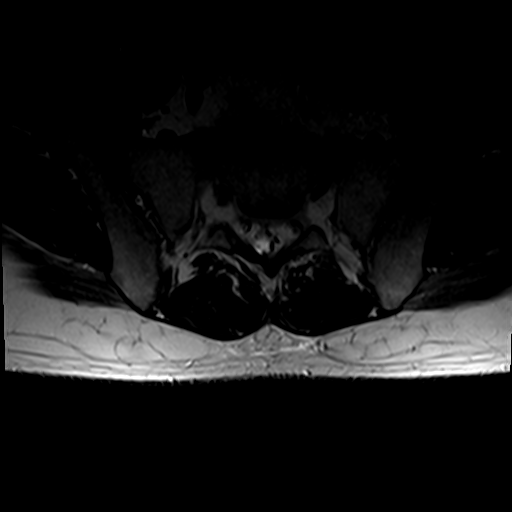
[im 7/52]
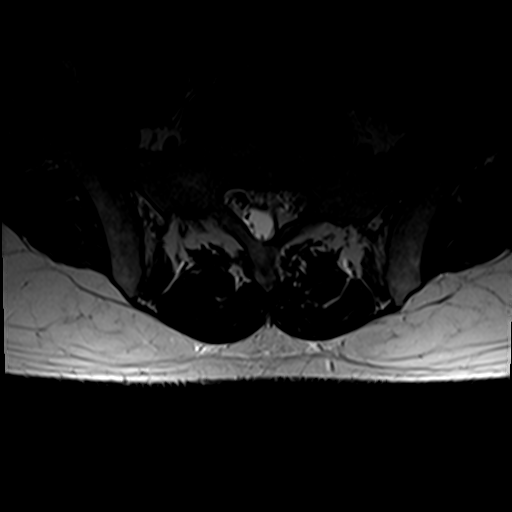
[im 11/52]
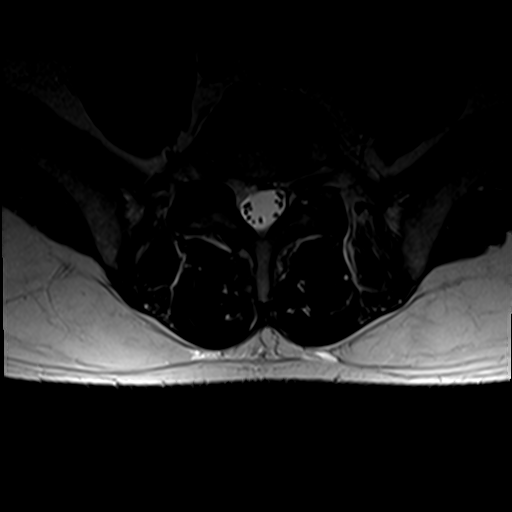
[im 18/52]
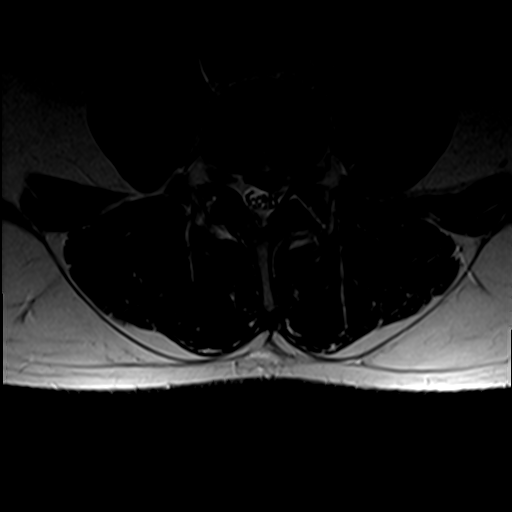
[im 24/52]
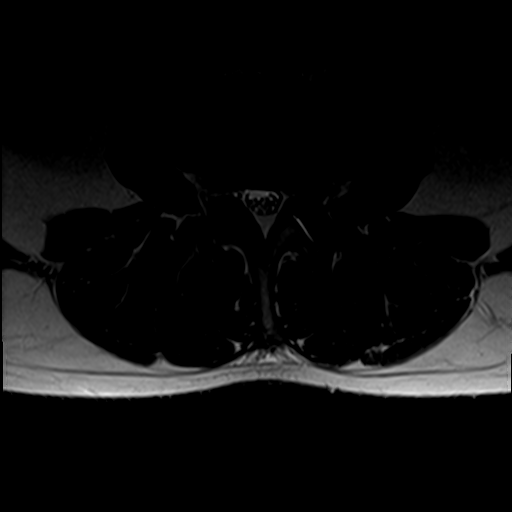
[im 28/52]
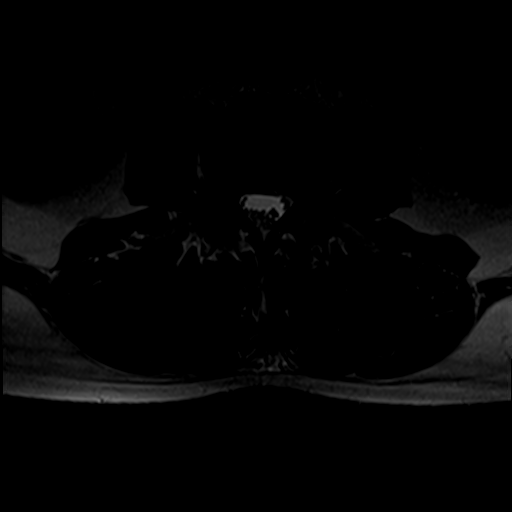
[im 31/52]
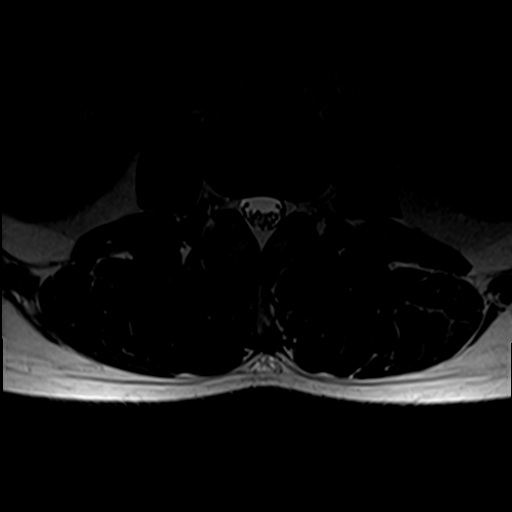
[im 38/52]
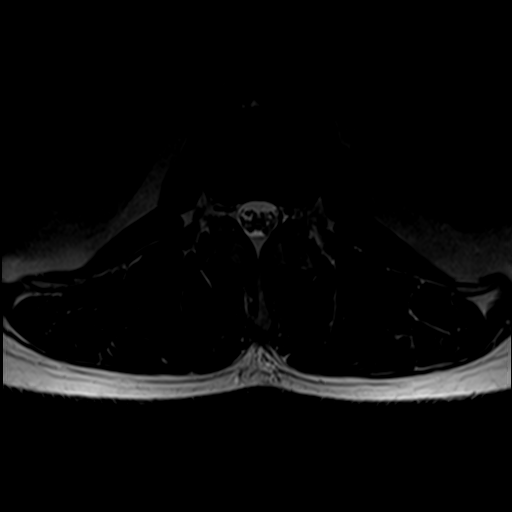
[im 45/52]
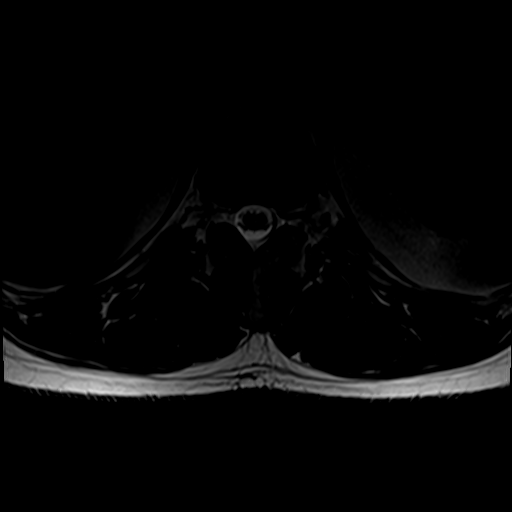

[Series 7: T1 · axial · 4.0mm · 0.31mm/px · z∈[-104,+90]mm · 3 of 52 slices shown (2 of 2)]
[im 7/52]
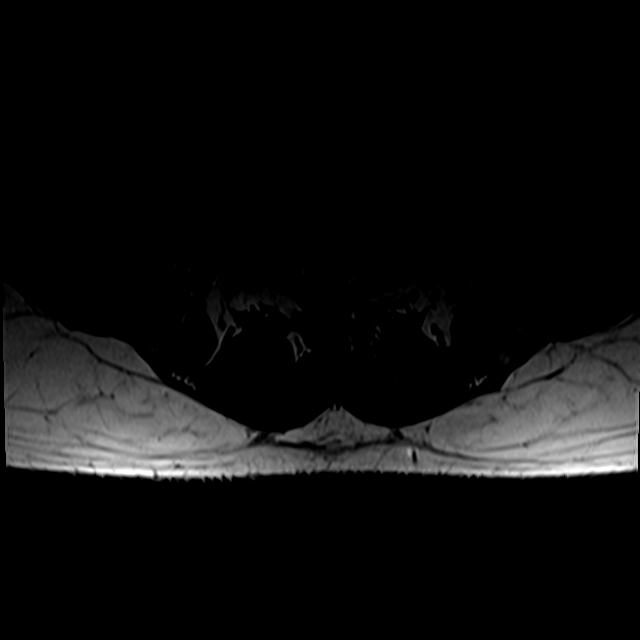
[im 28/52]
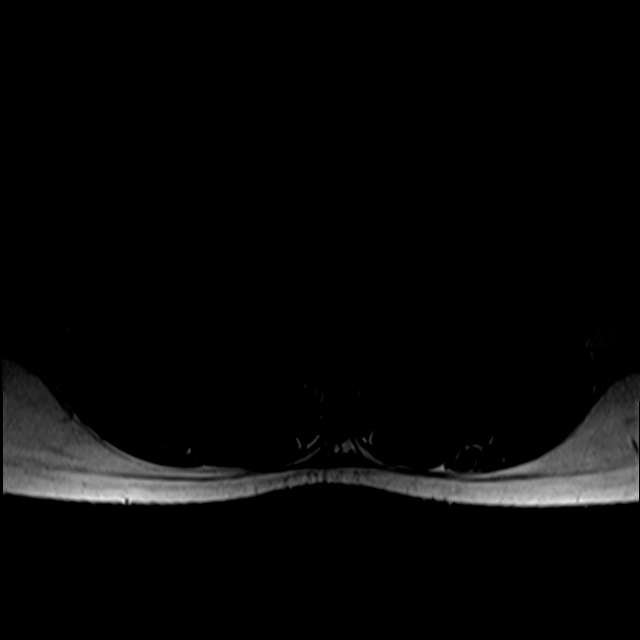
[im 45/52]
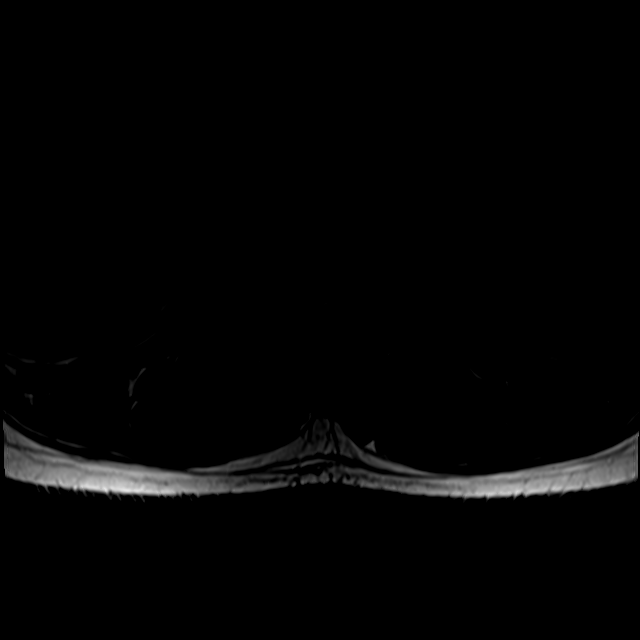

[21 of 48 positions shown; findings below may reference images not displayed]

FINDINGS: Segmentation:  Standard.

Alignment:  Physiologic.

Vertebrae:  No fracture, evidence of discitis, or bone lesion.

Conus medullaris and cauda equina: Conus extends to the L2 level.
Fatty filum. No posterior dysraphism.

Paraspinal and other soft tissues: Negative.

Disc levels:

L1-2: No significant disc displacement, foraminal stenosis, or canal
stenosis.

L2-3: No significant disc displacement, foraminal stenosis, or canal
stenosis.

L3-4: Disc bulge eccentric to the left with left-greater-than-right
mild foraminal and lateral recess stenosis. Mild canal stenosis.

L4-5: Left subarticular extrusion measuring 6 x 9 x 24 mm (AP x ML x
CC series 3, image 10 and series 6, image 37) with superior
migration to the L4 pedicle level. The extrusion effaces the left
lateral recess with contact on descending left L4 nerve root (series
6, image 34) and left L5 nerve root (series 3, image 11). Mild right
and moderate left foraminal stenosis. Mild-to-moderate canal
stenosis.

L5-S1: Is is disc bulge, endplate marginal osteophytes, and right
subarticular 8 mm protrusion. The protrusion contacts the descending
right S1 nerve root (series 3, image 17) moderate bilateral
foraminal stenosis. No canal stenosis.
IMPRESSION: 1. L4-5 left subarticular disc extrusion with superior migration.
Disc extrusion contacts the descending left L4 and L5 nerve roots in
the left lateral recess.
2. L5-S1 right subarticular disc protrusion. Disc protrusion
contacts descending right S1 nerve root in the lateral recess.
3. Mild L3-4 and mild-to-moderate L4-5 canal stenosis.
4. Multilevel mild and moderate foraminal stenosis.

By: Signorini Aramu M.D.

## 2019-04-16 IMAGING — CR DG ORBITS FOR FOREIGN BODY
2 series · 2 of 2 positions shown · non-contrast
Comparison: None.

CLINICAL DATA: Metal working/exposure; clearance prior to MRI

EXAM:
ORBITS FOR FOREIGN BODY - 2 VIEW

[w orbit pa (1 of 2)]
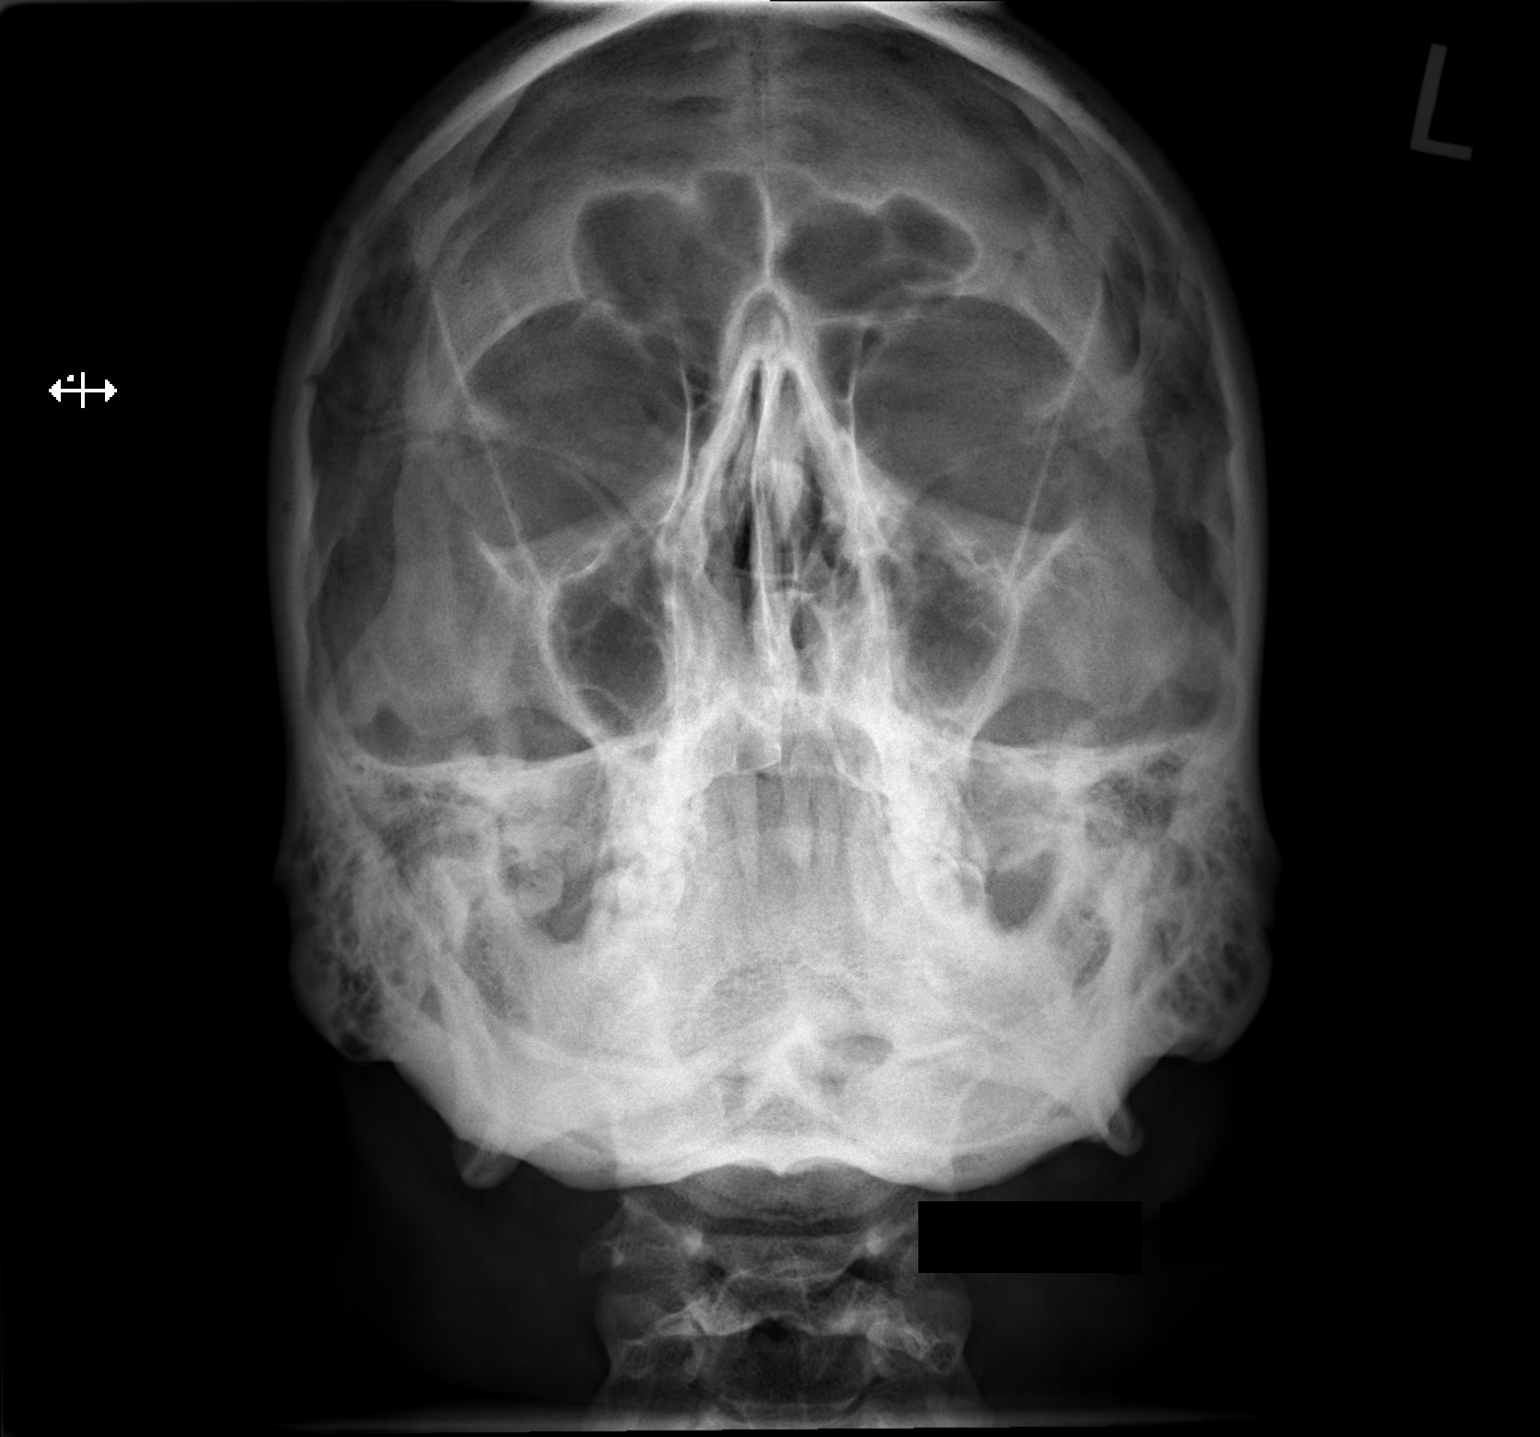

[w orbit pa (2 of 2)]
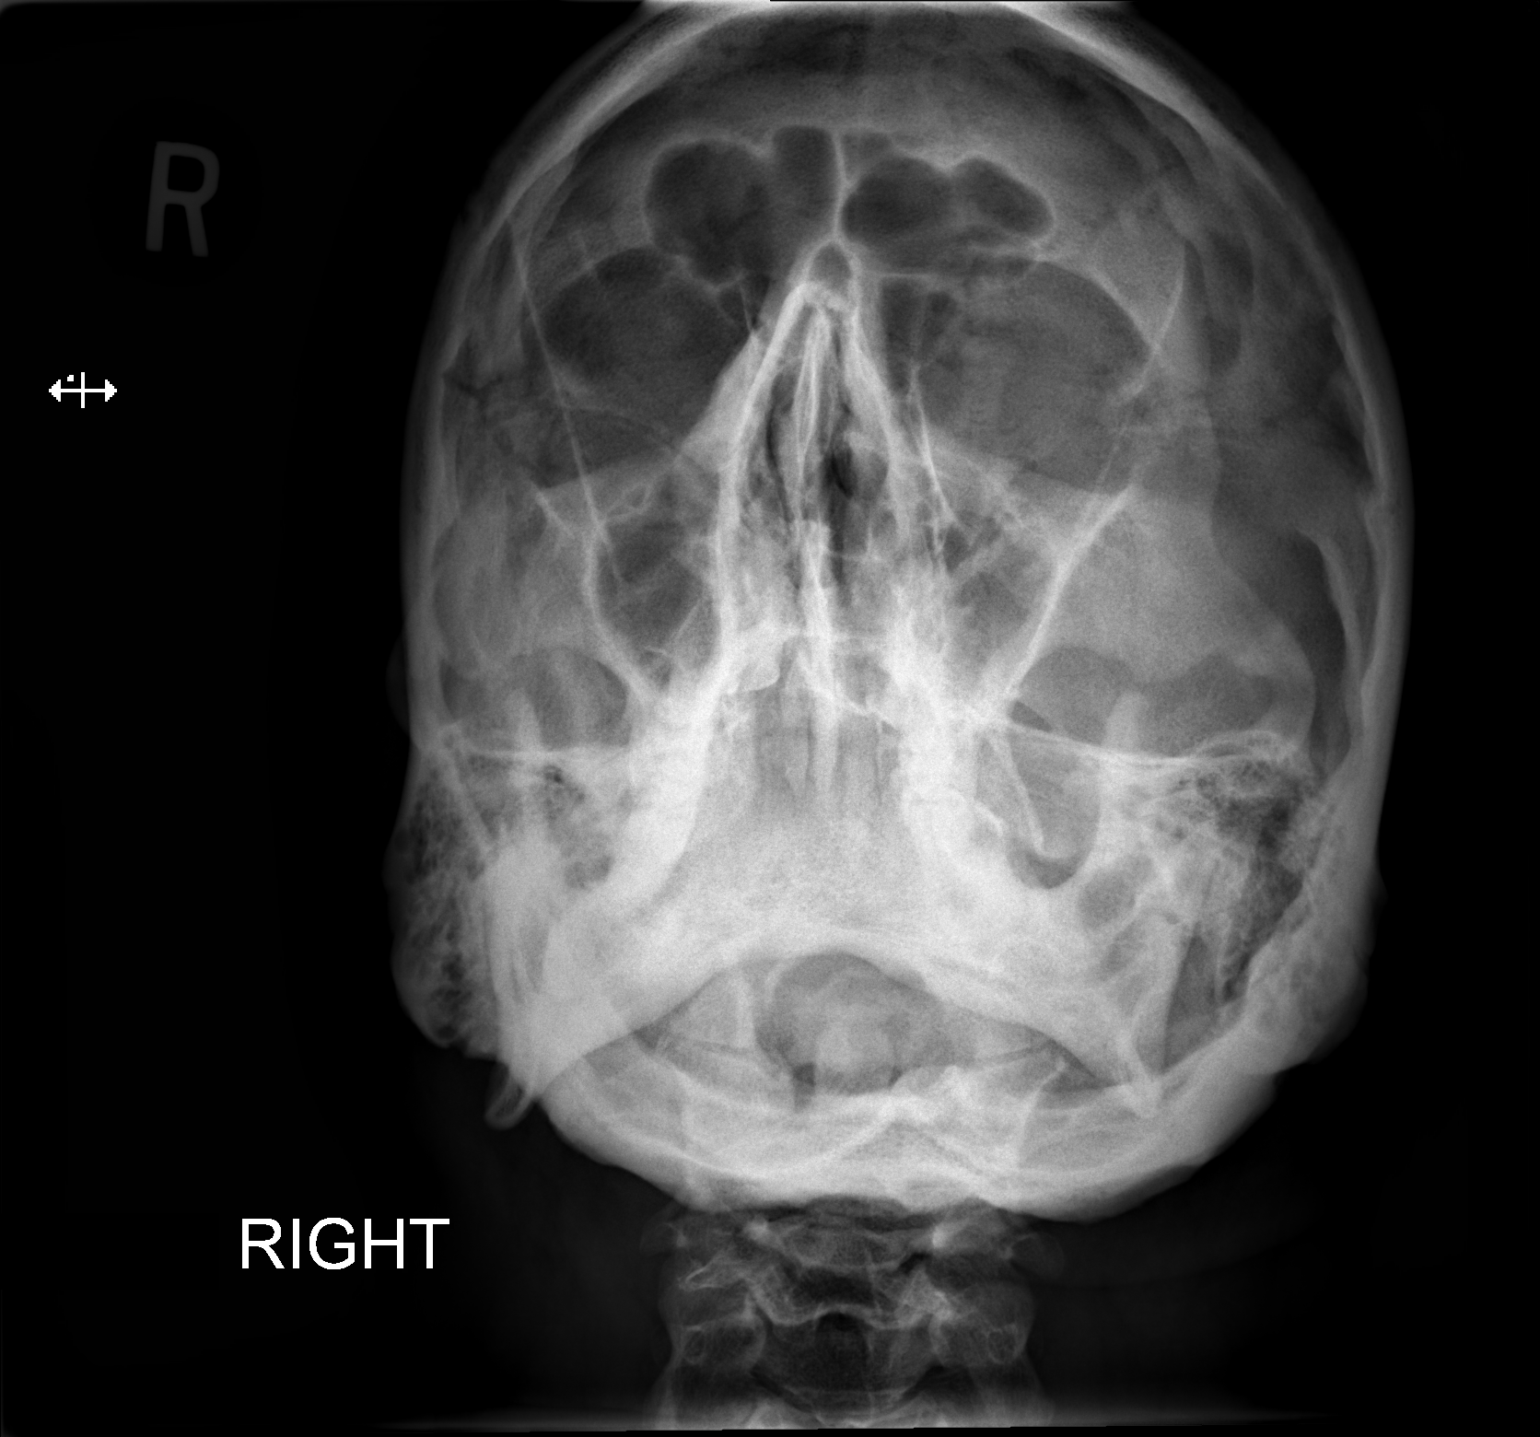

[2 of 2 positions shown; findings below may reference images not displayed]

FINDINGS: There is no evidence of metallic foreign body within the orbits. No
significant bone abnormality identified.
IMPRESSION: No evidence of metallic foreign body within the orbits.
